# Patient Record
Sex: Male | Born: 2000 | Race: White | Hispanic: No | Marital: Single | Smoking: Never smoker
Health system: Southern US, Community
[De-identification: ages and names within clinical notes are randomized; demographics above are authoritative.]

## PROBLEM LIST (undated history)

## (undated) DIAGNOSIS — K529 Noninfective gastroenteritis and colitis, unspecified: Secondary | ICD-10-CM

## (undated) HISTORY — DX: Noninfective gastroenteritis and colitis, unspecified: K52.9

---
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
## 2022-06-17 ENCOUNTER — Other Ambulatory Visit: Payer: Self-pay

## 2022-06-17 ENCOUNTER — Emergency Department (HOSPITAL_BASED_OUTPATIENT_CLINIC_OR_DEPARTMENT_OTHER): Payer: BC Managed Care – PPO

## 2022-06-17 ENCOUNTER — Encounter (HOSPITAL_BASED_OUTPATIENT_CLINIC_OR_DEPARTMENT_OTHER): Payer: Self-pay | Admitting: Emergency Medicine

## 2022-06-17 ENCOUNTER — Emergency Department (HOSPITAL_BASED_OUTPATIENT_CLINIC_OR_DEPARTMENT_OTHER)
Admission: EM | Admit: 2022-06-17 | Discharge: 2022-06-17 | Disposition: A | Discharge status: Home / Self Care | Payer: BC Managed Care – PPO | Attending: Emergency Medicine | Admitting: Emergency Medicine

## 2022-06-17 DIAGNOSIS — N50812 Left testicular pain: Secondary | ICD-10-CM | POA: Insufficient documentation

## 2022-06-17 LAB — URINALYSIS, ROUTINE W REFLEX MICROSCOPIC
Bilirubin Urine: NEGATIVE
Glucose, UA: NEGATIVE mg/dL
Hgb urine dipstick: NEGATIVE
Ketones, ur: NEGATIVE mg/dL
Leukocytes,Ua: NEGATIVE
Nitrite: NEGATIVE
Protein, ur: NEGATIVE mg/dL
Specific Gravity, Urine: 1.005 (ref 1.005–1.030)
pH: 7 (ref 5.0–8.0)

## 2022-06-17 MED ORDER — IBUPROFEN 400 MG PO TABS
600.0000 mg | ORAL_TABLET | Freq: Once | ORAL | Status: AC
  Administered 2022-06-17: 600 mg via ORAL
  Filled 2022-06-17: qty 1

## 2022-06-17 NOTE — ED Triage Notes (Addendum)
Pt via pv from home with groin pain that started today. Pt states his left testicle hurts intermittently with various degress of severity. He went to [facility] and was told he needed ultrasound to determine if he has torsion or hernia or neither. Denies penile discharge, n/v, urinary symptoms. Pt alert & oriented, nad noted.

## 2022-06-17 NOTE — ED Provider Notes (Signed)
[facility]ENC[facility] DEPT Provider Note   CSN: [id number] Arrival date & time: 06/17/22  1654     History  Chief Complaint  Patient presents with   Testicle Pain    Sean Gates is a 21 y.o. male.   Testicle Pain   21 year old male presents emergency department with complaints of left testicle pain.  Patient reports symptom onset upon awakening this morning.  Was seen by an urgent care earlier today and sent to emergency department for evaluation.  He states that now, pain is almost resolved.  Denies history of similar symptoms in the past.  Denies any urinary symptoms including dysuria, hematuria, discharge, frequency/urgency.  Denies any recent sexual activity and states he is unconcerned for STD.  Denies fever, chills, night sweats, abdominal pain, nausea, vomiting, change in bowel habits.  He is taken no medication for this.  No significant pertinent past medical history  Home Medications Prior to Admission medications   Medication Sig Start Date End Date Taking? Authorizing Provider  Calcium-Vitamin D-Vitamin K [id number] MG-UNT-MCG CHEW Chew 1 each by mouth daily. 11/03/12 11/03/13  Oletha Blend, MD  PEDIA-LAX FIBER GUMMIES CHEW Chew 1 tablet by mouth daily. 02/19/11   Oletha Blend, MD  ranitidine (ZANTAC) 75 MG tablet Take 75 mg by mouth daily.    [provider]      Allergies    Patient has no known allergies.    Review of Systems   Review of Systems  Genitourinary:  Positive for testicular pain.  All other systems reviewed and are negative.   Physical Exam Updated Vital Signs BP (!) 148/88   Pulse 90   Temp 98.8 F (37.1 C) (Oral)   Resp 18   Ht 5\' 6"  (1.676 m)   Wt 65.8 kg   SpO2 100%   BMI 23.40 kg/m  Physical Exam Vitals and nursing note reviewed.  Constitutional:      General: He is not in acute distress.    Appearance: He is well-developed.  HENT:     Head: Normocephalic and atraumatic.  Eyes:      Conjunctiva/sclera: Conjunctivae normal.  Cardiovascular:     Rate and Rhythm: Normal rate and regular rhythm.     Heart sounds: No murmur heard. Pulmonary:     Effort: Pulmonary effort is normal. No respiratory distress.     Breath sounds: Normal breath sounds.  Abdominal:     Palpations: Abdomen is soft.     Tenderness: There is no abdominal tenderness.     Hernia: There is no hernia in the left inguinal area or right inguinal area.  Genitourinary:    Penis: Normal and circumcised.      Testes: Cremasteric reflex is present.     Epididymis:     Right: No tenderness.     Left: Tenderness present.     Comments: Patient has a vertical lie of bilateral testicles.  Cremasteric reflex intact bilaterally.  No overlying skin abnormalities noted.  Patient tender to palpation of superior aspect of the left testicle along epididymal head; no obvious palpable abnormality with compared to the right.  No obvious testicular tenderness.  No obvious varicocele or hydrocele palpable.  No appreciable inguinal hernia as patient Valsalva. Musculoskeletal:        General: No swelling.     Cervical back: Neck supple.  Skin:    General: Skin is warm and dry.     Capillary Refill: Capillary refill takes less than 2 seconds.  Neurological:  Mental Status: He is alert.  Psychiatric:        Mood and Affect: Mood normal.     ED Results / Procedures / Treatments   Labs (all labs ordered are listed, but only abnormal results are displayed) Labs Reviewed  URINALYSIS, ROUTINE W REFLEX MICROSCOPIC - Abnormal; Notable for the following components:      Result Value   Color, Urine COLORLESS (*)    All other components within normal limits    EKG None  Radiology US SCROTUM W/DOPPLER  Result Date: 06/17/2022 CLINICAL DATA:  [id number] Testicle pain [id number] EXAM: ULTRASOUND OF SCROTUM TECHNIQUE: Complete ultrasound examination of the testicles, epididymis, and other scrotal structures was performed.  COMPARISON:  None Available. FINDINGS: The right testis measured 4.4 x 3.3 x 2.4 cm, and the left testis measured 4.4 x 3.1 x 2.1 cm. The testes demonstrated blood flow with Doppler. There was an unremarkable appearance of the epididymides. No fluid collections or masses were seen. IMPRESSION: Negative. No evidence for testicular mass or other significant abnormality. Electronically Signed   By: Layla Maw M.D.   On: 06/17/2022 18:37    Procedures Procedures    Medications Ordered in ED Medications  ibuprofen (ADVIL) tablet 600 mg (600 mg Oral Given 06/17/22 1750)    ED Course/ Medical Decision Making/ A&P                           Medical Decision Making Amount and/or Complexity of Data Reviewed Labs: ordered. Radiology: ordered.   This patient presents to the ED for concern of testicular pain, this involves an extensive number of treatment options, and is a complaint that carries with it a high risk of complications and morbidity.  The differential diagnosis includes testicular torsion, epididymitis, hernia, hydrocele, varicocele, cellulitis, erysipelas, malignancy   Co morbidities that complicate the patient evaluation  See HPI   Additional history obtained:  Additional history obtained from EMR External records from outside source obtained and reviewed including hospital records   Lab Tests:  I Ordered, and personally interpreted labs.  The pertinent results include:  UA without abnormality   Imaging Studies ordered:  I ordered imaging studies including testicular ultrasound I independently visualized and interpreted imaging which showed no abnormality appreciated I agree with the radiologist interpretation   Cardiac Monitoring: / EKG:  The patient was maintained on a cardiac monitor.  I personally viewed and interpreted the cardiac monitored which showed an underlying rhythm of: Sinus rhythm   Consultations Obtained:  N/a   Problem List / ED Course /  Critical interventions / Medication management  Left testicular pain I ordered medication including ibuprofen for pain   Reevaluation of the patient after these medicines showed that the patient resolved I have reviewed the patients home medicines and have made adjustments as needed   Social Determinants of Health:  Denies tobacco, illicit drug use   Test / Admission - Considered:  Left testicle pain Vitals signs significant for mild hypertension with blood pressure 148/88.  Recommend close follow-up with primary care regarding ablation blood pressure.. Otherwise within normal range and stable throughout visit. Laboratory/imaging studies significant for: See above Unsure of exact etiology of patient's left testicular pain.  Reassured with overall resolution of pain with administration of Motrin while in the emergency department.  No evidence of testicular torsion, epididymitis, hydrocele/varicocele, obvious hernia or other testicular abnormality.  Reassured with overall negative work-up.  Recommend follow-up with urology outpatient given  patient's symptoms.  Patient given strict return precautions given severity of potential partial torsion with subsequent detorsion given patient history.  Treatment plan discussed at length with patient and family and they acknowledge understanding were agreeable to said plan. Worrisome signs and symptoms were discussed with the patient, and the patient acknowledged understanding to return to the ED if noticed. Patient was stable upon discharge.          Final Clinical Impression(s) / ED Diagnoses Final diagnoses:  Pain in left testicle    Rx / DC Orders ED Discharge Orders     None         Wilnette Kales, [place] 06/17/22 1926    Elgie Congo, MD 06/18/22 1226

## 2022-06-17 NOTE — Discharge Instructions (Addendum)
The work-up today was over reassuring.  Ultrasound showed no signs of any acute abnormalities.  As discussed, use daily Motrin/Tylenol as needed for your pain.  Follow-up with urology outpatient for reevaluation of your visit today..  Please not hesitate to return to the emergency department if the worrisome signs and symptoms we discussed become apparent.
